# Patient Record
Sex: Female | Born: 1975 | Race: White | Hispanic: No | Marital: Married
Health system: Southern US, Community
[De-identification: ages and names within clinical notes are randomized; demographics above are authoritative.]

---

## 1997-12-11 ENCOUNTER — Encounter: Admission: RE | Admit: 1997-12-11 | Discharge: 1997-12-11 | Payer: Self-pay | Admitting: Family Medicine

## 1998-01-08 ENCOUNTER — Encounter: Admission: RE | Admit: 1998-01-08 | Discharge: 1998-01-08 | Payer: Self-pay | Admitting: Family Medicine

## 1998-02-17 ENCOUNTER — Encounter: Admission: RE | Admit: 1998-02-17 | Discharge: 1998-02-17 | Payer: Self-pay | Admitting: Family Medicine

## 1998-03-12 ENCOUNTER — Encounter: Admission: RE | Admit: 1998-03-12 | Discharge: 1998-03-12 | Payer: Self-pay | Admitting: Sports Medicine

## 1998-07-03 ENCOUNTER — Encounter: Admission: RE | Admit: 1998-07-03 | Discharge: 1998-07-03 | Payer: Self-pay | Admitting: Family Medicine

## 1998-07-08 ENCOUNTER — Encounter: Admission: RE | Admit: 1998-07-08 | Discharge: 1998-07-08 | Payer: Self-pay | Admitting: Family Medicine

## 1999-03-25 ENCOUNTER — Encounter: Admission: RE | Admit: 1999-03-25 | Discharge: 1999-03-25 | Payer: Self-pay | Admitting: Family Medicine

## 2009-03-15 ENCOUNTER — Emergency Department (HOSPITAL_COMMUNITY): Admission: EM | Admit: 2009-03-15 | Discharge: 2009-03-16 | Payer: Self-pay | Admitting: Emergency Medicine

## 2010-05-24 LAB — BASIC METABOLIC PANEL
BUN: 6 mg/dL (ref 6–23)
GFR calc non Af Amer: >60 mL/min (ref >60)
Glucose, Bld: 93 mg/dL (ref 70–99)
Potassium: 3.2 mEq/L — ABNORMAL LOW (ref 3.5–5.1)

## 2010-05-24 LAB — DIFFERENTIAL
Basophils Absolute: 0 10*3/uL (ref 0.0–0.1)
Basophils Relative: 0 % (ref 0–1)
Eosinophils Absolute: 0 10*3/uL (ref 0.0–0.7)
Eosinophils Relative: 0 % (ref 0–5)
Lymphocytes Relative: 20 % (ref 12–46)

## 2010-05-24 LAB — HEPATIC FUNCTION PANEL
ALT: 11 U/L (ref 0–35)
AST: 17 U/L (ref 0–37)
Albumin: 4.2 g/dL (ref 3.5–5.2)
Alkaline Phosphatase: 110 U/L (ref 39–117)
Bilirubin, Direct: 0.1 mg/dL (ref 0.0–0.3)
Indirect Bilirubin: 0.6 mg/dL (ref 0.3–0.9)
Total Bilirubin: 0.7 mg/dL (ref 0.3–1.2)
Total Protein: 7.4 g/dL (ref 6.0–8.3)

## 2010-05-24 LAB — RAPID URINE DRUG SCREEN, HOSP PERFORMED
Benzodiazepines: POSITIVE — AB
Cocaine: NOT DETECTED
Tetrahydrocannabinol: NOT DETECTED

## 2010-05-24 LAB — CBC
HCT: 43.5 % (ref 36.0–46.0)
MCHC: 33.8 g/dL (ref 30.0–36.0)
MCV: 93.8 fL (ref 78.0–100.0)
Platelets: 209 10*3/uL (ref 150–400)
RDW: 12.3 % (ref 11.5–15.5)

## 2010-05-24 LAB — ACETAMINOPHEN LEVEL: Acetaminophen (Tylenol), Serum: 30.8 ug/mL — ABNORMAL HIGH (ref 10–30)

## 2010-05-24 LAB — POCT PREGNANCY, URINE: Preg Test, Ur: NEGATIVE

## 2015-03-13 ENCOUNTER — Other Ambulatory Visit: Payer: Self-pay | Admitting: Obstetrics and Gynecology

## 2015-03-13 DIAGNOSIS — R928 Other abnormal and inconclusive findings on diagnostic imaging of breast: Secondary | ICD-10-CM

## 2015-03-19 ENCOUNTER — Other Ambulatory Visit: Payer: Self-pay

## 2015-03-26 ENCOUNTER — Ambulatory Visit
Admission: RE | Admit: 2015-03-26 | Discharge: 2015-03-26 | Disposition: A | Discharge status: Home / Self Care | Payer: Commercial Managed Care - PPO | Source: Ambulatory Visit | Attending: Obstetrics and Gynecology | Admitting: Obstetrics and Gynecology

## 2015-03-26 ENCOUNTER — Other Ambulatory Visit: Payer: Self-pay | Admitting: Obstetrics and Gynecology

## 2015-03-26 DIAGNOSIS — R928 Other abnormal and inconclusive findings on diagnostic imaging of breast: Secondary | ICD-10-CM

## 2016-05-24 ENCOUNTER — Other Ambulatory Visit: Payer: Self-pay | Admitting: Obstetrics and Gynecology

## 2016-05-24 DIAGNOSIS — Z1231 Encounter for screening mammogram for malignant neoplasm of breast: Secondary | ICD-10-CM

## 2016-06-10 ENCOUNTER — Ambulatory Visit
Admission: RE | Admit: 2016-06-10 | Discharge: 2016-06-10 | Disposition: A | Discharge status: Home / Self Care | Payer: Commercial Managed Care - PPO | Source: Ambulatory Visit | Attending: Obstetrics and Gynecology | Admitting: Obstetrics and Gynecology

## 2016-06-10 DIAGNOSIS — Z1231 Encounter for screening mammogram for malignant neoplasm of breast: Secondary | ICD-10-CM

## 2017-03-11 ENCOUNTER — Ambulatory Visit: Payer: Self-pay | Admitting: Podiatry

## 2017-03-11 ENCOUNTER — Encounter: Payer: Self-pay | Admitting: Podiatry

## 2017-03-11 ENCOUNTER — Ambulatory Visit (INDEPENDENT_AMBULATORY_CARE_PROVIDER_SITE_OTHER): Payer: Commercial Managed Care - PPO | Admitting: Podiatry

## 2017-03-11 DIAGNOSIS — L603 Nail dystrophy: Secondary | ICD-10-CM | POA: Diagnosis not present

## 2017-03-11 DIAGNOSIS — M79675 Pain in left toe(s): Secondary | ICD-10-CM
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
## 2017-03-11 NOTE — Patient Instructions (Signed)

## 2017-03-21 ENCOUNTER — Ambulatory Visit: Payer: Commercial Managed Care - PPO | Admitting: Podiatry

## 2019-10-11 ENCOUNTER — Other Ambulatory Visit: Payer: Self-pay | Admitting: Family Medicine

## 2019-10-11 DIAGNOSIS — Z1231 Encounter for screening mammogram for malignant neoplasm of breast: Secondary | ICD-10-CM

## 2020-01-21 ENCOUNTER — Ambulatory Visit
Admission: RE | Admit: 2020-01-21 | Discharge: 2020-01-21 | Disposition: A | Discharge status: Home / Self Care | Payer: Commercial Managed Care - PPO | Source: Ambulatory Visit | Attending: Family Medicine | Admitting: Family Medicine

## 2020-01-21 ENCOUNTER — Other Ambulatory Visit: Payer: Self-pay

## 2020-01-21 DIAGNOSIS — Z1231 Encounter for screening mammogram for malignant neoplasm of breast: Secondary | ICD-10-CM

## 2021-11-13 IMAGING — MG DIGITAL SCREENING BILAT W/ TOMO W/ CAD
8 series · 8 of 24 positions shown · non-contrast
Comparison: Previous exam(s).

CLINICAL DATA: Screening.

EXAM:
DIGITAL SCREENING BILATERAL MAMMOGRAM WITH TOMO AND CAD

[L CC synth-2D]
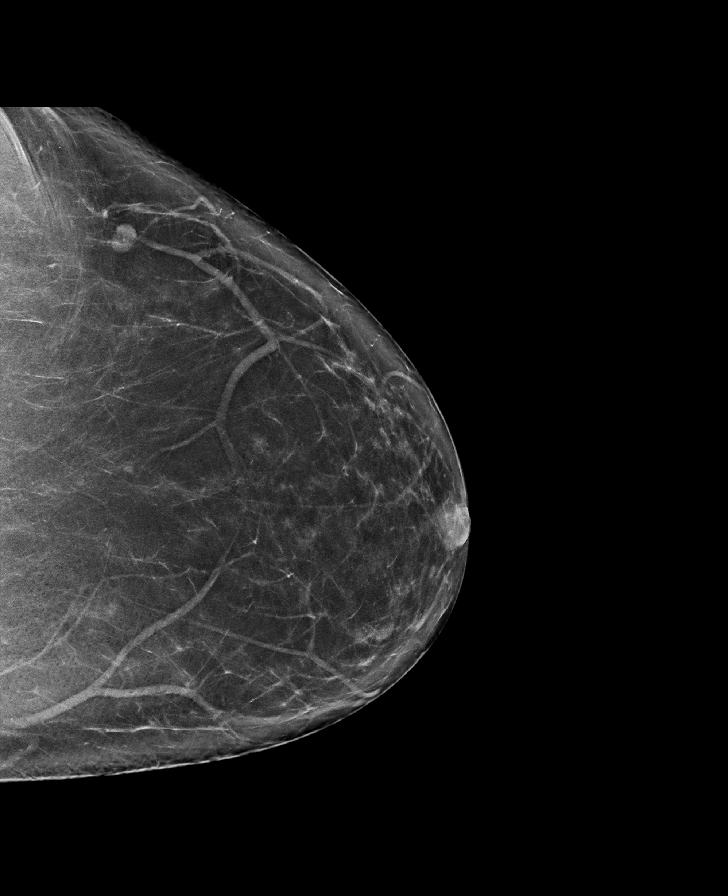

[R CC synth-2D]
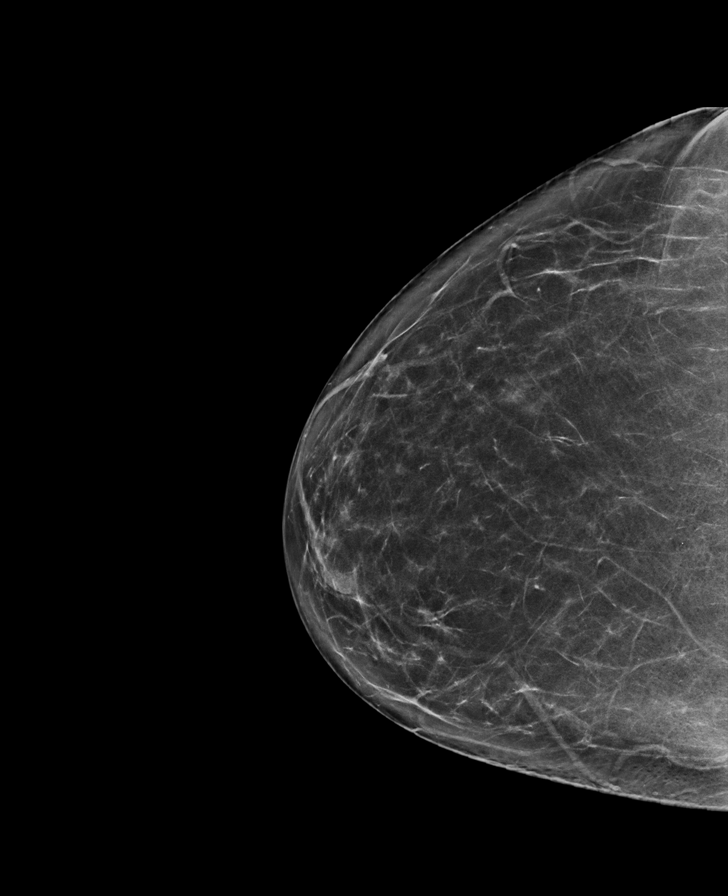

[L MLO synth-2D]
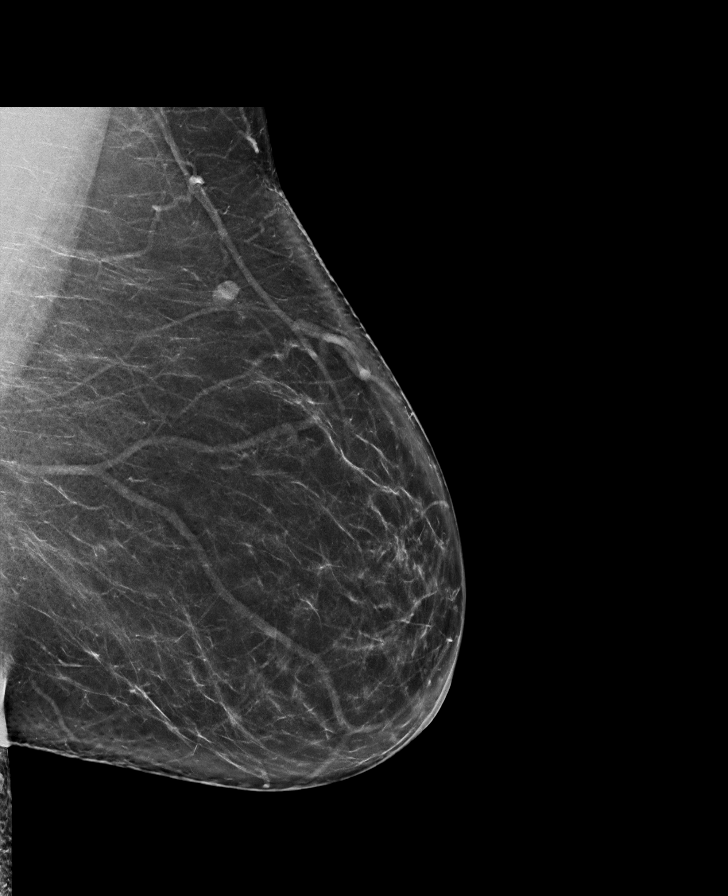

[R MLO synth-2D]
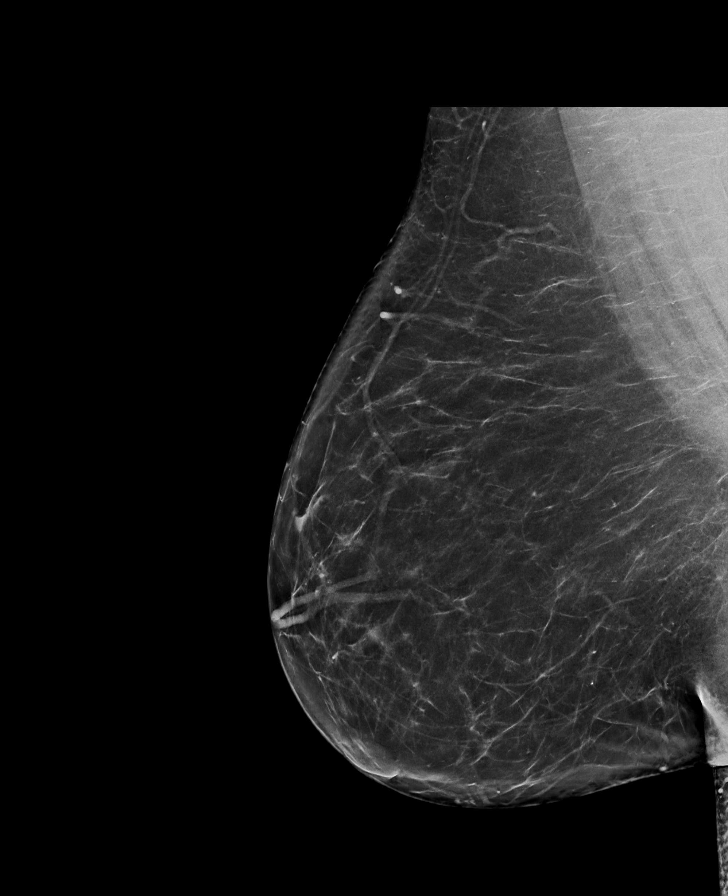

[R CC tomo · tomo slice 38/75.0]
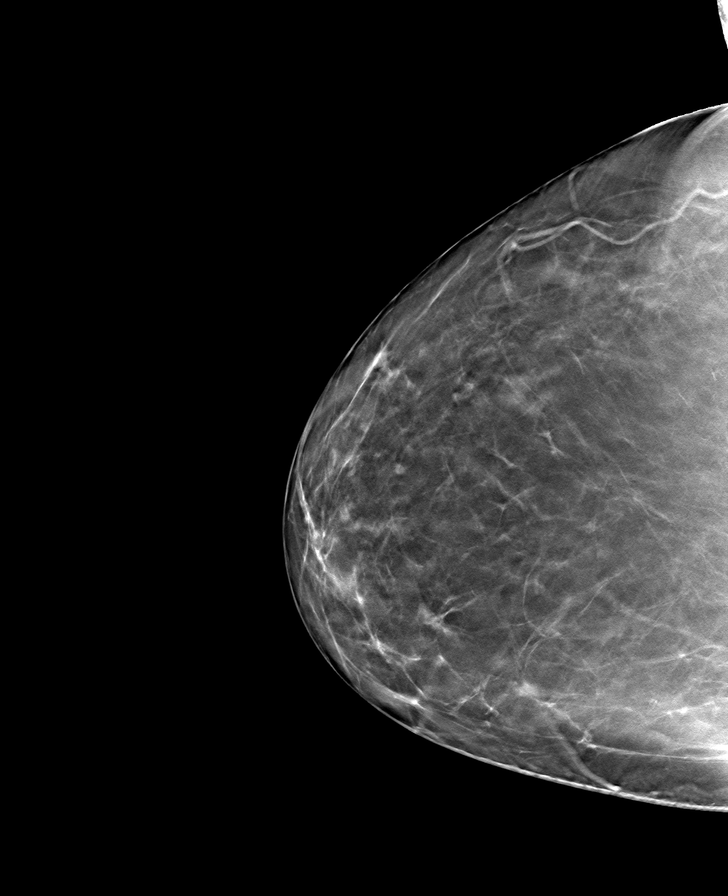

[L MLO tomo · tomo slice 42/83.0]
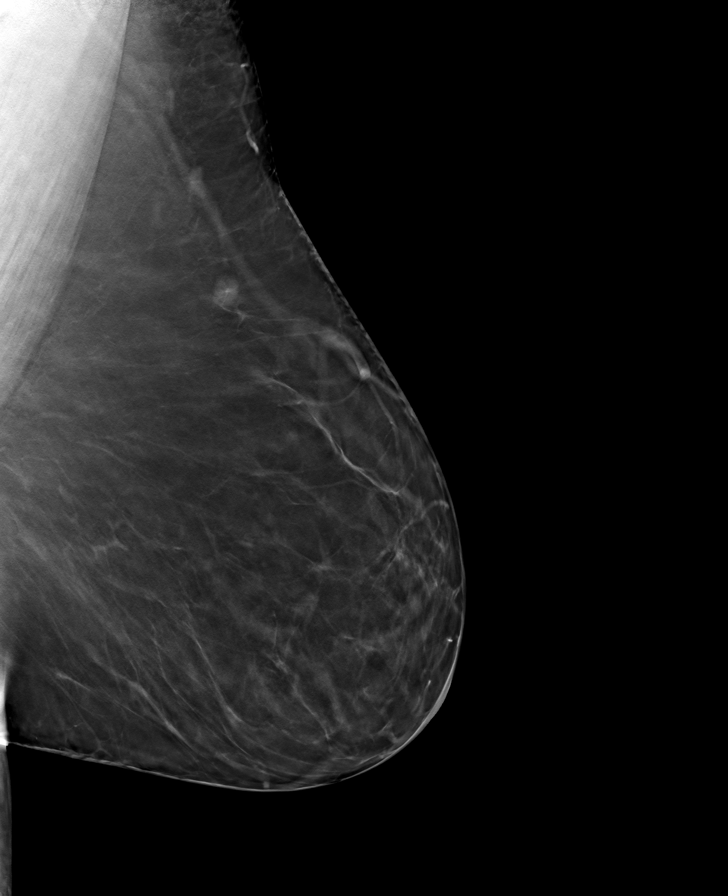

[R MLO tomo · tomo slice 44/87.0]
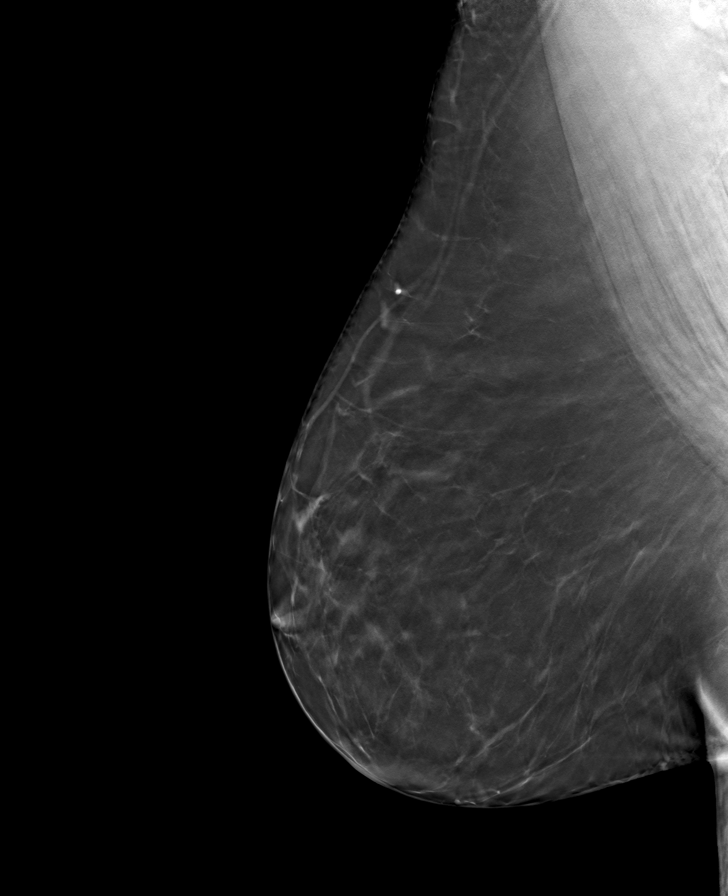

[L CC tomo · tomo slice 39/77.0]
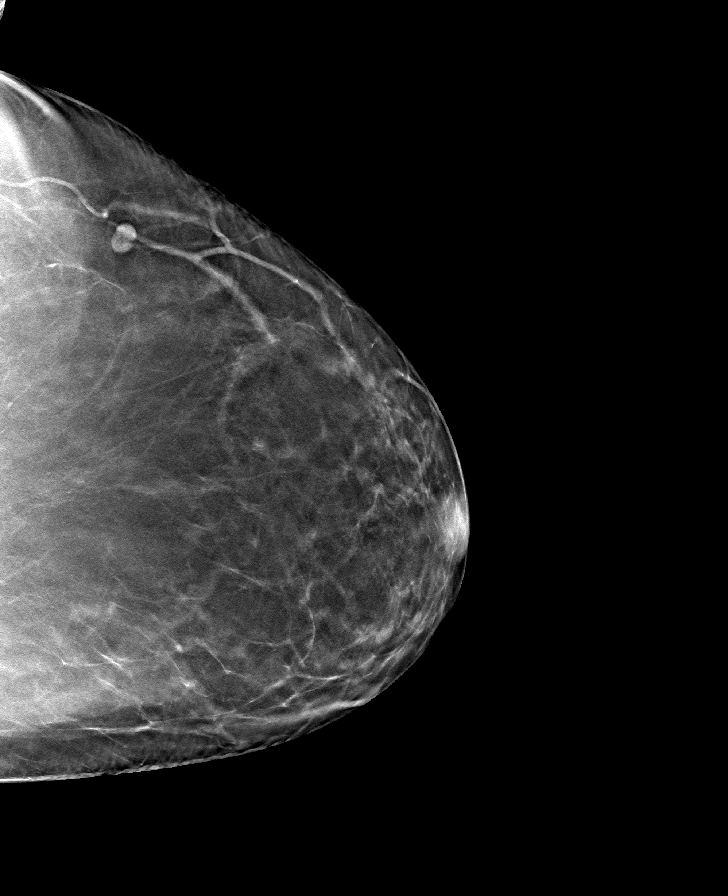

[8 of 24 positions shown; findings below may reference images not displayed]

ACR Breast Density Category b: There are scattered areas of
fibroglandular density.
FINDINGS: There are no findings suspicious for malignancy. Images were
processed with CAD.
IMPRESSION: No mammographic evidence of malignancy. A result letter of this
screening mammogram will be mailed directly to the patient.

RECOMMENDATION:
Screening mammogram in one year. (Code:[id number])

BI-RADS CATEGORY  1: Negative.

## 2022-09-08 ENCOUNTER — Other Ambulatory Visit (INDEPENDENT_AMBULATORY_CARE_PROVIDER_SITE_OTHER): Payer: Managed Care, Other (non HMO)

## 2022-09-08 ENCOUNTER — Ambulatory Visit: Payer: Managed Care, Other (non HMO) | Admitting: Family

## 2022-09-08 DIAGNOSIS — M79672 Pain in left foot: Secondary | ICD-10-CM

## 2022-09-08 DIAGNOSIS — M7742 Metatarsalgia, left foot: Secondary | ICD-10-CM

## 2022-09-08 DIAGNOSIS — M2012 Hallux valgus (acquired), left foot: Secondary | ICD-10-CM

## 2022-09-10 NOTE — Progress Notes (Signed)
Office Visit Note   Patient: Christine Walters           Date of Birth: Oct 31, 1975           MRN: [id number] Visit Date: 09/08/2022              Requested by: No referring provider defined for this encounter. PCP: Patient, No Pcp Per  Chief Complaint  Patient presents with   Left Foot - Pain      HPI: The patient is a 47 year old woman who presents today for initial evaluation of a many month history of pain beneath the ball of her left foot she states that she has been walking on the outside of her foot because this is more comfortable.  She has been using Advil without any relief over the last few weeks she feels that she is her toes are separating in the third webspace  Assessment & Plan: Visit Diagnoses:  1. Pain in left foot   2. Hallux valgus (acquired), left foot     Plan: Given a metatarsal pad today discussed supportive shoewear heel cord stretching.  Discussed developing bunion at this point she would like to proceed with conservative measures.  May advance to regular shoewear with an orthotic such as a sole orthotic or the metatarsal pads provided today.  She will follow-up in 4 weeks for reevaluation  Follow-Up Instructions: No follow-ups on file.   Ortho Exam  Patient is alert, oriented, no adenopathy, well-dressed, normal affect, normal respiratory effort. On examination of the left foot the foot is plantigrade she does have a mild bunion deformity of the great toe.  There is some callus buildup on the left medial column.  There is no erythema she has heel cord tightness with dorsiflexion to neutral  Imaging: No results found. No images are attached to the encounter.  Labs: No results found for: "HGBA1C", "ESRSEDRATE", "CRP", "LABURIC", "REPTSTATUS", "GRAMSTAIN", "CULT", "LABORGA"   Lab Results  Component Value Date   ALBUMIN 4.2 03/15/2009    No results found for: "MG" No results found for: "VD25OH"  No results found for: "PREALBUMIN"    Latest Ref  Rng & Units 03/15/2009    6:39 PM  CBC EXTENDED  WBC 4.0 - 10.5 K/uL 8.6   RBC 3.87 - 5.11 MIL/uL 4.64   Hemoglobin 12.0 - 15.0 g/dL 40.9   HCT 81.1 - 91.4 % 43.5   Platelets 150 - 400 K/uL 209   NEUT# 1.7 - 7.7 K/uL 6.3   Lymph# 0.7 - 4.0 K/uL 1.7      There is no height or weight on file to calculate BMI.  Orders:  Orders Placed This Encounter  Procedures   XR Foot 2 Views Left   No orders of the defined types were placed in this encounter.    Procedures: No procedures performed  Clinical Data: No additional findings.  ROS:  All other systems negative, except as noted in the HPI. Review of Systems  Objective: Vital Signs: There were no vitals taken for this visit.  Specialty Comments:  No specialty comments available.  PMFS History: There are no problems to display for this patient.  No past medical history on file.  Family History  Problem Relation Age of Onset   Breast cancer Maternal Grandmother     No past surgical history on file. Social History   Occupational History   Not on file  Tobacco Use   Smoking status: Not on file   Smokeless tobacco:  Not on file  Substance and Sexual Activity   Alcohol use: Not on file   Drug use: Not on file   Sexual activity: Not on file

## 2022-09-20 ENCOUNTER — Telehealth: Payer: Self-pay | Admitting: Family

## 2022-09-20 NOTE — Telephone Encounter (Signed)
Patient;s mother called asked if patient can get set up for an MRI? The number to contact victoria is [phone]

## 2022-09-28 NOTE — Telephone Encounter (Signed)
That isnt indicated for her foot pain. If no better can offer her an appointment with duda

## 2022-09-28 NOTE — Telephone Encounter (Signed)
Called pt's mom back to inform her of below. Phone rang several times with no answer or VM pick up.

## 2022-10-18 NOTE — Telephone Encounter (Signed)
Patient called advised her Left foot is not any better and wants to know if she can be set up for an MRI. Phone number is [phone]

## 2022-10-18 NOTE — Telephone Encounter (Signed)
Per Denny Peon there is nothing indicated from  the xray that pt needs an MRI. Cannot do MRI just for foot pain, would not be covered. Since she is no better can you call her back to schedule an appt per Erin's advise below? Thank you!!!

## 2022-10-26 ENCOUNTER — Ambulatory Visit: Payer: Managed Care, Other (non HMO) | Admitting: Family

## 2022-11-09 ENCOUNTER — Ambulatory Visit: Payer: Managed Care, Other (non HMO) | Admitting: Family

## 2023-07-29 ENCOUNTER — Ambulatory Visit: Admitting: Physician Assistant

## 2023-07-29 ENCOUNTER — Encounter: Payer: Self-pay | Admitting: Physician Assistant

## 2023-07-29 DIAGNOSIS — M2012 Hallux valgus (acquired), left foot: Secondary | ICD-10-CM | POA: Insufficient documentation

## 2023-07-29 DIAGNOSIS — M7742 Metatarsalgia, left foot: Secondary | ICD-10-CM | POA: Diagnosis not present

## 2023-07-29 NOTE — Progress Notes (Signed)
 Office Visit Note   Patient: Christine Walters           Date of Birth: 09-16-1975           MRN: [id number] Visit Date: 07/29/2023              Requested by: No referring provider defined for this encounter. PCP: Patient, No Pcp Per  Chief Complaint  Patient presents with   Left Foot - Pain      HPI: Patient is a pleasant 48 year old woman who comes in today with a chief complaint of left forefoot pain.  She saw [facility] last year and was diagnosed with hallux valgus deformity and metatarsalgia.  She was given several suggestions and was given metatarsal pads.  She still is having some pain mostly under the second metatarsal head.  She has used metatarsal pads but has not replaced them.   Assessment & Plan: Visit Diagnoses:  1. Hallux valgus (acquired), left foot   2. Metatarsalgia, left foot     Plan: Pleasant 48 year old woman with valgus deformity metatarsalgia.  We once again talked about options such as bunion taping which I instructed her in today new metatarsal pads.  Also talked about gave her a toe spacer as she does have some overlapping.  Briefly talked about hallux valgus correction and shortening osteotomy of the second metatarsal.  Would encourage her to visit with Dr. Julio Ohm if she does not get good conservative treatment results  Follow-Up Instructions: Return if symptoms worsen or fail to improve.   Ortho Exam  Patient is alert, oriented, no adenopathy, well-dressed, normal affect, normal respiratory effort. Examination of the left foot no redness no erythema no swelling with weightbearing she does have crossover of the second toe over the great toe.  She has hallux valgus deformity.  Strong pulses.  Tenderness under the second metatarsal head  Imaging: No results found. No images are attached to the encounter.  Labs: No results found for: "HGBA1C", "ESRSEDRATE", "CRP", "LABURIC", "REPTSTATUS", "GRAMSTAIN", "CULT", "LABORGA"   Lab Results  Component Value Date    ALBUMIN 4.2 03/15/2009    No results found for: "MG" No results found for: "VD25OH"  No results found for: "PREALBUMIN"    Latest Ref Rng & Units 03/15/2009    6:39 PM  CBC EXTENDED  WBC 4.0 - 10.5 K/uL 8.6   RBC 3.87 - 5.11 MIL/uL 4.64   Hemoglobin 12.0 - 15.0 g/dL 16.0   HCT 73.7 - 10.6 % 43.5   Platelets 150 - 400 K/uL 209   NEUT# 1.7 - 7.7 K/uL 6.3   Lymph# 0.7 - 4.0 K/uL 1.7      There is no height or weight on file to calculate BMI.  Orders:  No orders of the defined types were placed in this encounter.  No orders of the defined types were placed in this encounter.    Procedures: No procedures performed  Clinical Data: No additional findings.  ROS:  All other systems negative, except as noted in the HPI. Review of Systems  Objective: Vital Signs: There were no vitals taken for this visit.  Specialty Comments:  No specialty comments available.  PMFS History: Patient Active Problem List   Diagnosis Date Noted   Hallux valgus (acquired), left foot 07/29/2023   Metatarsalgia, left foot 07/29/2023   History reviewed. No pertinent past medical history.  Family History  Problem Relation Age of Onset   Breast cancer Maternal Grandmother     History reviewed. No  pertinent surgical history. Social History   Occupational History   Not on file  Tobacco Use   Smoking status: Not on file   Smokeless tobacco: Not on file  Substance and Sexual Activity   Alcohol use: Not on file   Drug use: Not on file   Sexual activity: Not on file
